# Patient Record
Sex: Female | Born: 2000 | Race: White | Hispanic: No | Marital: Single | Smoking: Never smoker
Health system: Southern US, Community
[De-identification: ages and names within clinical notes are randomized; demographics above are authoritative.]

---
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
## 2020-01-23 ENCOUNTER — Ambulatory Visit (INDEPENDENT_AMBULATORY_CARE_PROVIDER_SITE_OTHER): Payer: BC Managed Care – PPO

## 2020-01-23 ENCOUNTER — Encounter: Payer: Self-pay | Admitting: Emergency Medicine

## 2020-01-23 ENCOUNTER — Ambulatory Visit
Admission: EM | Admit: 2020-01-23 | Discharge: 2020-01-23 | Disposition: A | Discharge status: Home / Self Care | Payer: BC Managed Care – PPO | Attending: Emergency Medicine | Admitting: Emergency Medicine

## 2020-01-23 ENCOUNTER — Other Ambulatory Visit: Payer: Self-pay

## 2020-01-23 DIAGNOSIS — R1031 Right lower quadrant pain: Secondary | ICD-10-CM

## 2020-01-23 DIAGNOSIS — K5900 Constipation, unspecified: Secondary | ICD-10-CM

## 2020-01-23 LAB — CBC WITH DIFFERENTIAL/PLATELET
Abs Immature Granulocytes: 0 10*3/uL (ref 0.00–0.07)
Basophils Absolute: 0 10*3/uL (ref 0.0–0.1)
Basophils Relative: 1 %
Eosinophils Absolute: 0.1 10*3/uL (ref 0.0–0.5)
Eosinophils Relative: 3 %
HCT: 40.8 % (ref 36.0–46.0)
Hemoglobin: 13.5 g/dL (ref 12.0–15.0)
Immature Granulocytes: 0 %
Lymphocytes Relative: 36 %
Lymphs Abs: 1.5 10*3/uL (ref 0.7–4.0)
MCH: 28.7 pg (ref 26.0–34.0)
MCHC: 33.1 g/dL (ref 30.0–36.0)
MCV: 86.6 fL (ref 80.0–100.0)
Monocytes Absolute: 0.4 10*3/uL (ref 0.1–1.0)
Monocytes Relative: 9 %
Neutro Abs: 2.2 10*3/uL (ref 1.7–7.7)
Neutrophils Relative %: 51 %
Platelets: 258 10*3/uL (ref 150–400)
RBC: 4.71 MIL/uL (ref 3.87–5.11)
RDW: 13.2 % (ref 11.5–15.5)
WBC: 4.3 10*3/uL (ref 4.0–10.5)
nRBC: 0 % (ref 0.0–0.2)

## 2020-01-23 LAB — URINALYSIS, COMPLETE (UACMP) WITH MICROSCOPIC
Bilirubin Urine: NEGATIVE
Glucose, UA: NEGATIVE mg/dL
Hgb urine dipstick: NEGATIVE
Ketones, ur: NEGATIVE mg/dL
Nitrite: NEGATIVE
Protein, ur: NEGATIVE mg/dL
RBC / HPF: NONE SEEN RBC/hpf (ref 0–5)
Specific Gravity, Urine: 1.02 (ref 1.005–1.030)
pH: 7 (ref 5.0–8.0)

## 2020-01-23 NOTE — Discharge Instructions (Addendum)
The abdominal xray is showing a large amount of stool on the area of pain. ( ascending colon). Get Magnesium Citrate and drink all of it to help you empty out your colon. For prevention of constipation, increase your fiber and water intake and you may take a stool softner daily like Colace or Miralax if needed.  If the Right lower abdominal pain gets worse, please go to ER.  Your white cell count is normal, so there are no signs of infection or anemia.  Your urine has a lot of bacterial, so I am sending it for for a culture to check for infection.

## 2020-01-23 NOTE — ED Triage Notes (Signed)
Patient c/o RLQ pain that started yesterday. She also reports decreased appetite for 1 week.

## 2020-01-23 NOTE — ED Provider Notes (Signed)
[facility]    CSN: [id number] Arrival date & time: 01/23/20  1257      History   Chief Complaint Chief Complaint  Patient presents with  . Abdominal Pain    RLQ    HPI Cindy Dawson is a 19 y.o. female. who presents with no appetite x 1 week and RLQ pain started yesterday. Has hx of chronic constipation. Las Bm was 2 days ago. Denies abnormal vaginal discharge. She has not had sex in 6 months. LMP 1 week ago.   History reviewed. No pertinent past medical history.  There are no problems to display for this patient.   History reviewed. No pertinent surgical history.  OB History   No obstetric history on file.      Home Medications    Prior to Admission medications   Medication Sig Start Date End Date Taking? Authorizing Provider  benzonatate (TESSALON) 100 MG capsule Take 1 capsule (100 mg total) by mouth every 8 (eight) hours. 10/26/19   LampteyMyrene Galas, MD    Family History Family History  Problem Relation Age of Onset  . Healthy Mother   . Diabetes Father   . Hypertension Father     Social History Social History   Tobacco Use  . Smoking status: Never Smoker  . Smokeless tobacco: Never Used  Substance Use Topics  . Alcohol use: Never  . Drug use: Never     Allergies   Patient has no known allergies.   Review of Systems Review of Systems  Constitutional: Positive for appetite change. Negative for chills and fever.  HENT: Negative for congestion and rhinorrhea.   Respiratory: Negative for cough.   Genitourinary: Negative for dysuria, frequency, menstrual problem, urgency and vaginal discharge.  Musculoskeletal: Negative for myalgias.  Skin: Negative for rash.  Neurological: Negative for dizziness.  Hematological: Negative for adenopathy.     Physical Exam Triage Vital Signs ED Triage Vitals  Enc Vitals Group     BP 01/23/20 1315 (!) 125/94     Pulse Rate 01/23/20 1315 77     Resp 01/23/20 1315 18     Temp 01/23/20 1315  98 F (36.7 C)     Temp Source 01/23/20 1315 Oral     SpO2 01/23/20 1315 100 %     Weight 01/23/20 1311 147 lb (66.7 kg)     Height 01/23/20 1311 5\' 6"  (1.676 m)     Head Circumference --      Peak Flow --      Pain Score 01/23/20 1311 3     Pain Loc --      Pain Edu? --      Excl. in [place]? --    No data found.  Updated Vital Signs BP (!) 125/94 (BP Location: Right Arm)   Pulse 77   Temp 98 F (36.7 C) (Oral)   Resp 18   Ht 5\' 6"  (1.676 m)   Wt 147 lb (66.7 kg)   LMP 01/16/2020   SpO2 100%   BMI 23.73 kg/m   Visual Acuity Right Eye Distance:   Left Eye Distance:   Bilateral Distance:    Right Eye Near:   Left Eye Near:    Bilateral Near:     Physical Exam Vitals and nursing note reviewed.  Constitutional:      General: She is not in acute distress.    Appearance: She is not toxic-appearing.  HENT:     Head: Atraumatic.  Eyes:  Extraocular Movements: Extraocular movements intact.  Pulmonary:     Effort: Pulmonary effort is normal.  Abdominal:     General: Abdomen is flat. Bowel sounds are normal.     Palpations: Abdomen is soft.     Tenderness: There is abdominal tenderness in the right lower quadrant. There is no guarding or rebound. Negative signs include Murphy's sign, psoas sign and obturator sign.     Hernia: No hernia is present.     Comments: Has mild tenderness on RLQ  Genitourinary:    Vagina: Normal.     Uterus: Normal. Not enlarged and not tender.      Adnexa: Right adnexa normal and left adnexa normal.       Right: No mass or tenderness.         Left: No mass or tenderness.       Comments: During the bimanual exam I can feel a large amount of stool in the cecum Skin:    General: Skin is warm and dry.     Findings: No rash.  Neurological:     Mental Status: She is alert and oriented to person, place, and time.  Psychiatric:        Behavior: Behavior normal.     UC Treatments / Results  Labs (all labs ordered are listed, but only  abnormal results are displayed) Labs Reviewed  URINALYSIS, COMPLETE (UACMP) WITH MICROSCOPIC - Abnormal; Notable for the following components:      Result Value   APPearance CLOUDY (*)    Leukocytes,Ua SMALL (*)    Bacteria, UA MANY (*)    All other components within normal limits  CBC WITH DIFFERENTIAL/PLATELET    EKG   Radiology No results found.  Procedures Procedures (including critical care time)  Medications Ordered in UC Medications - No data to display  Initial Impression / Assessment and Plan / UC Course  I have reviewed the triage vital signs and the nursing notes. Pertinent labs & imaging results that were available during my care of the patient were reviewed by me and considered in my medical decision making (see chart for details). She has a large amt of stool on the Ascending colon which could be why she is having pain on this area. Her CBC was normal. Her UA shows moderate bacteria and small leuks, but she does not have UTI symptoms, so I ordered a urine culture. We will inform her when the results are back.  Advised to drink Magnesium Citrate today to help her clean up her colon  Final Clinical Impressions(s) / UC Diagnoses   Final diagnoses:  None   Discharge Instructions   None    ED Prescriptions    None     PDMP not reviewed this encounter.   Garey Ham, PA-C 01/23/20 1425

## 2020-01-24 LAB — URINE CULTURE
Culture: NO GROWTH
Special Requests: NORMAL

## 2021-04-02 ENCOUNTER — Encounter: Payer: BC Managed Care – PPO | Admitting: Obstetrics and Gynecology

## 2021-05-12 ENCOUNTER — Encounter: Payer: BC Managed Care – PPO | Admitting: Obstetrics and Gynecology

## 2021-10-28 IMAGING — CR DG ABDOMEN 1V
2 series · 2 of 2 positions shown · non-contrast
Comparison: None.

CLINICAL DATA: Pt states no appetite x 1 week but started having
RLQ pain yesterday. No other symptoms. She is generally constipated
and that isnt uncommon for her

EXAM:
ABDOMEN - 1 VIEW

[abdomen kub (1 of 2)]
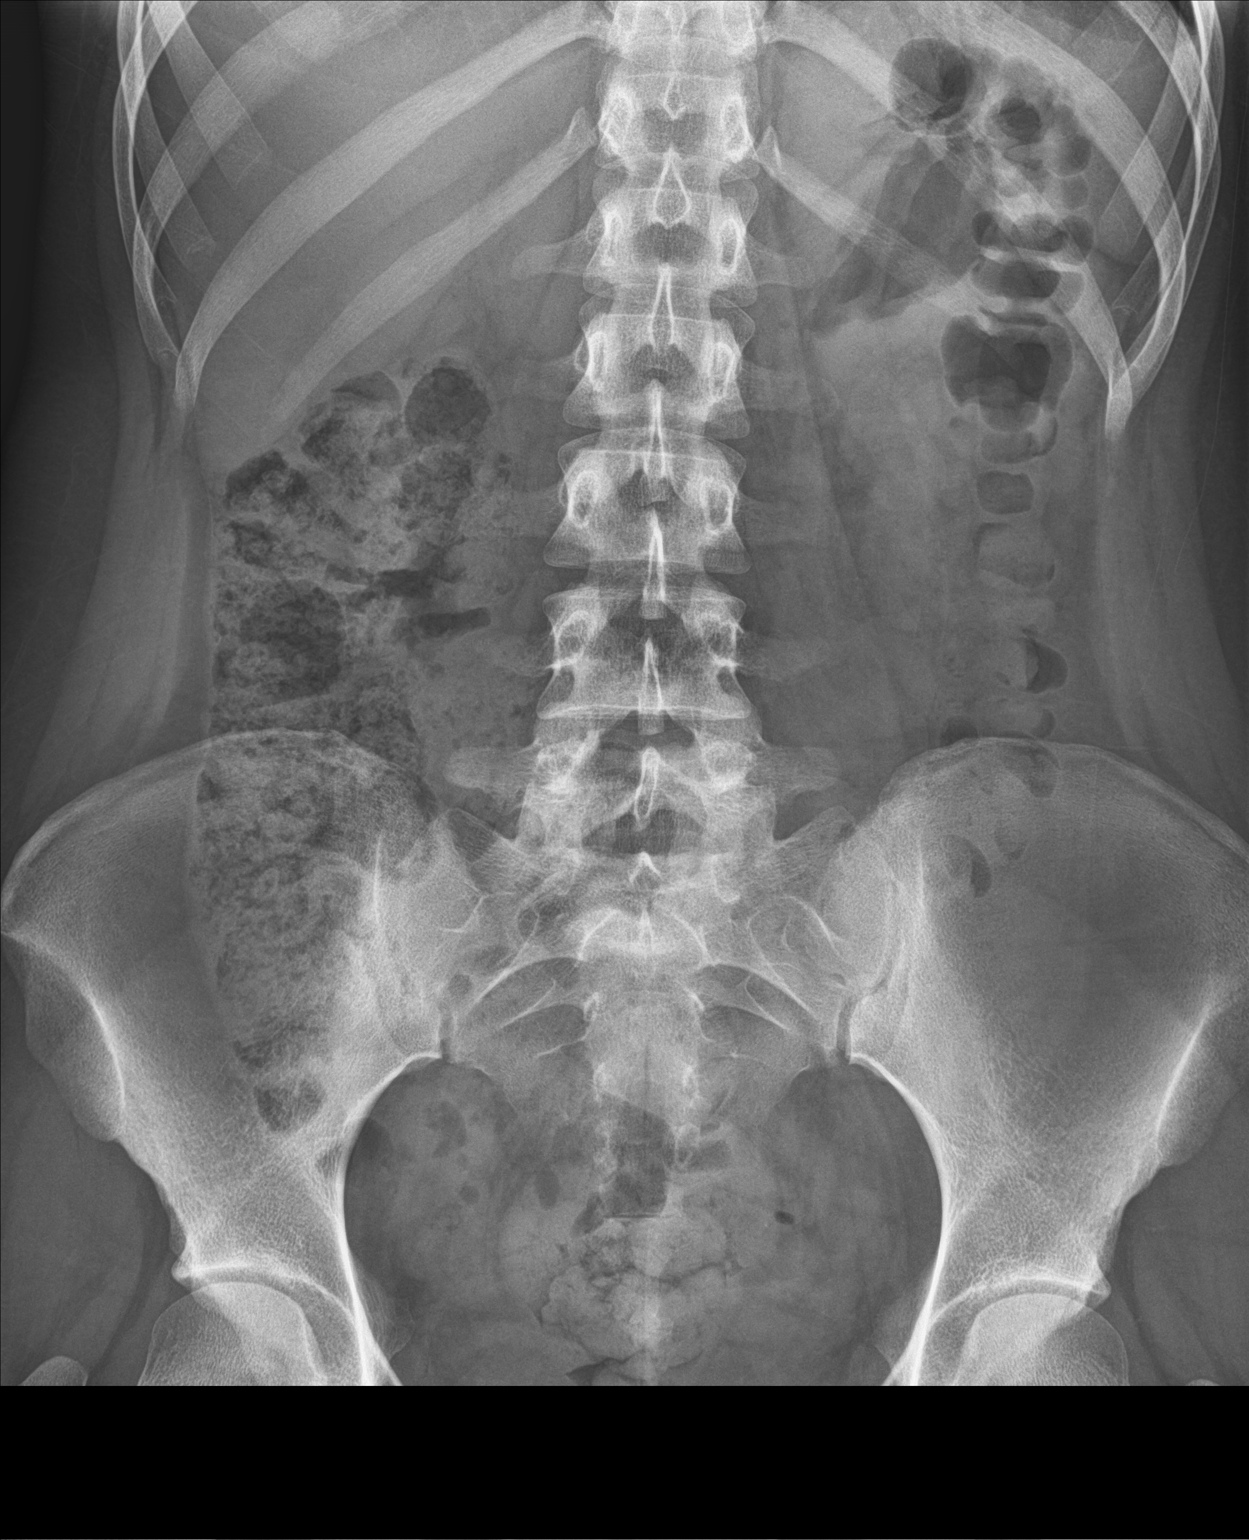

[abdomen kub (2 of 2)]
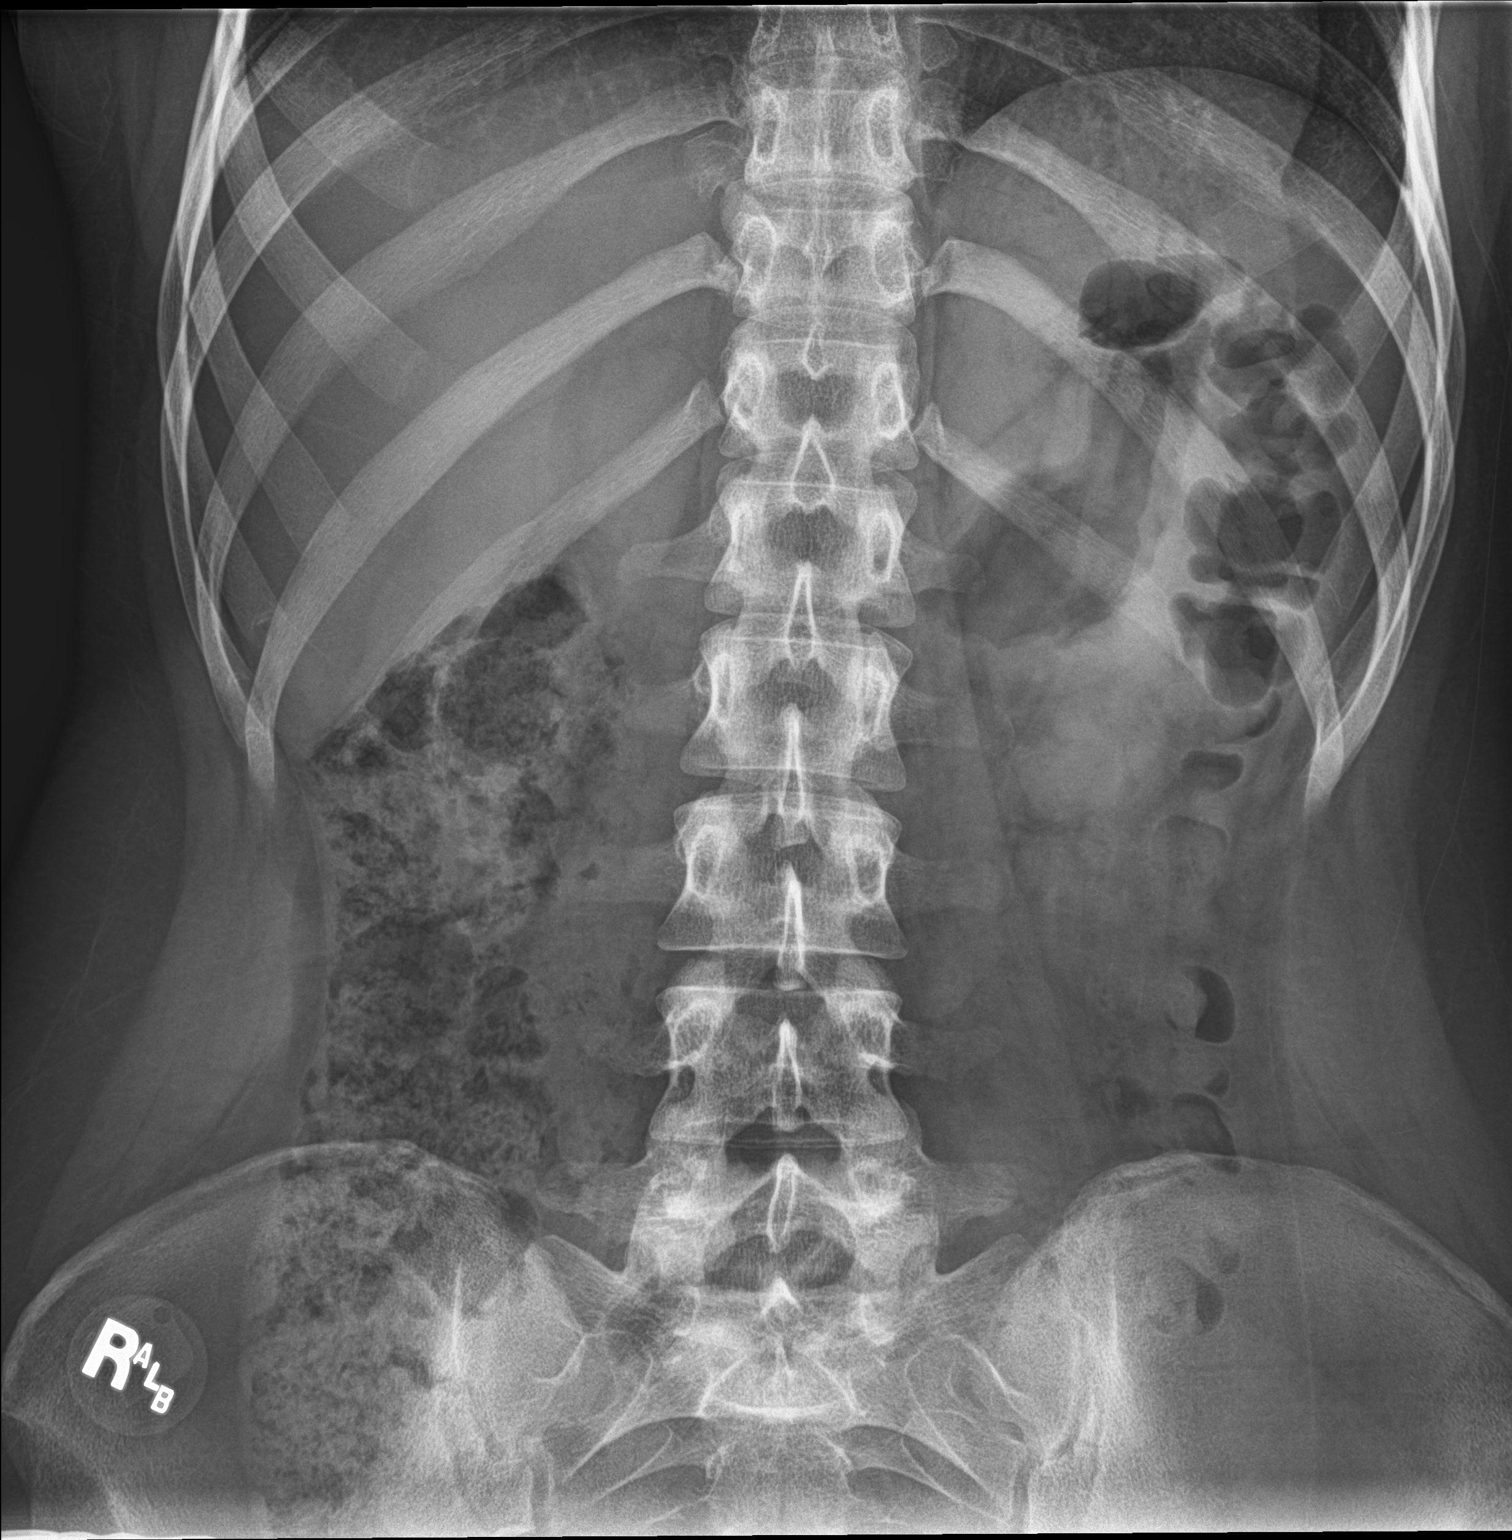

[2 of 2 positions shown; findings below may reference images not displayed]

FINDINGS: No bowel dilation to suggest obstruction. Mild to moderate increased
colonic stool burden with mild increased stool within the rectum.

Soft tissues and skeletal structures are unremarkable.
IMPRESSION: 1. No acute findings.  No bowel obstruction.
2. Mild to moderate increased colonic stool burden.
